# Patient Record
Sex: Female | Born: 1970 | Race: White | Hispanic: No | Smoking: Current every day smoker
Health system: Southern US, Community
[De-identification: ages and names within clinical notes are randomized; demographics above are authoritative.]

## PROBLEM LIST (undated history)

## (undated) DIAGNOSIS — E7800 Pure hypercholesterolemia, unspecified: Secondary | ICD-10-CM

## (undated) DIAGNOSIS — I10 Essential (primary) hypertension: Secondary | ICD-10-CM

## (undated) DIAGNOSIS — G629 Polyneuropathy, unspecified: Secondary | ICD-10-CM

## (undated) DIAGNOSIS — E119 Type 2 diabetes mellitus without complications: Secondary | ICD-10-CM

## (undated) HISTORY — PX: KNEE SURGERY: SHX244

## (undated) HISTORY — PX: CHOLECYSTECTOMY: SHX55

---

## 2015-09-26 ENCOUNTER — Inpatient Hospital Stay (HOSPITAL_COMMUNITY): Payer: Medicare Other

## 2015-09-26 ENCOUNTER — Inpatient Hospital Stay (HOSPITAL_COMMUNITY): Payer: Medicare Other | Admitting: Anesthesiology

## 2015-09-26 ENCOUNTER — Inpatient Hospital Stay (HOSPITAL_COMMUNITY)
Admission: EM | Admit: 2015-09-26 | Discharge: 2015-09-27 | DRG: 494 | Disposition: A | Discharge status: Home / Self Care | Payer: Medicare Other | Attending: Orthopedic Surgery | Admitting: Orthopedic Surgery

## 2015-09-26 ENCOUNTER — Encounter (HOSPITAL_COMMUNITY): Payer: Self-pay | Admitting: *Deleted

## 2015-09-26 ENCOUNTER — Encounter (HOSPITAL_COMMUNITY)
Admission: EM | Disposition: A | Discharge status: Home / Self Care | Payer: Self-pay | Source: Home / Self Care | Attending: Orthopedic Surgery

## 2015-09-26 DIAGNOSIS — E119 Type 2 diabetes mellitus without complications: Secondary | ICD-10-CM | POA: Diagnosis present

## 2015-09-26 DIAGNOSIS — Y92000 Kitchen of unspecified non-institutional (private) residence as  the place of occurrence of the external cause: Secondary | ICD-10-CM | POA: Diagnosis not present

## 2015-09-26 DIAGNOSIS — S82302A Unspecified fracture of lower end of left tibia, initial encounter for closed fracture: Secondary | ICD-10-CM | POA: Diagnosis present

## 2015-09-26 DIAGNOSIS — S82402A Unspecified fracture of shaft of left fibula, initial encounter for closed fracture: Secondary | ICD-10-CM | POA: Diagnosis present

## 2015-09-26 DIAGNOSIS — I10 Essential (primary) hypertension: Secondary | ICD-10-CM | POA: Diagnosis present

## 2015-09-26 DIAGNOSIS — W010XXA Fall on same level from slipping, tripping and stumbling without subsequent striking against object, initial encounter: Secondary | ICD-10-CM | POA: Diagnosis present

## 2015-09-26 DIAGNOSIS — M25572 Pain in left ankle and joints of left foot: Secondary | ICD-10-CM | POA: Diagnosis present

## 2015-09-26 DIAGNOSIS — Z794 Long term (current) use of insulin: Secondary | ICD-10-CM

## 2015-09-26 DIAGNOSIS — Z79899 Other long term (current) drug therapy: Secondary | ICD-10-CM

## 2015-09-26 DIAGNOSIS — S82202A Unspecified fracture of shaft of left tibia, initial encounter for closed fracture: Secondary | ICD-10-CM | POA: Diagnosis present

## 2015-09-26 DIAGNOSIS — S82872A Displaced pilon fracture of left tibia, initial encounter for closed fracture: Principal | ICD-10-CM | POA: Diagnosis present

## 2015-09-26 DIAGNOSIS — F17200 Nicotine dependence, unspecified, uncomplicated: Secondary | ICD-10-CM | POA: Diagnosis present

## 2015-09-26 DIAGNOSIS — Z419 Encounter for procedure for purposes other than remedying health state, unspecified: Secondary | ICD-10-CM

## 2015-09-26 HISTORY — DX: Type 2 diabetes mellitus without complications: E11.9

## 2015-09-26 HISTORY — DX: Pure hypercholesterolemia, unspecified: E78.00

## 2015-09-26 HISTORY — DX: Essential (primary) hypertension: I10

## 2015-09-26 HISTORY — DX: Polyneuropathy, unspecified: G62.9

## 2015-09-26 HISTORY — PX: EXTERNAL FIXATION LEG: SHX1549

## 2015-09-26 LAB — BASIC METABOLIC PANEL
Anion gap: 8 (ref 5–15)
BUN: 8 mg/dL (ref 6–20)
CALCIUM: 9.2 mg/dL (ref 8.9–10.3)
CO2: 26 mmol/L (ref 22–32)
CREATININE: 0.61 mg/dL (ref 0.44–1.00)
Chloride: 105 mmol/L (ref 101–111)
GFR calc Af Amer: >60 mL/min (ref >60)
GLUCOSE: 260 mg/dL — AB (ref 65–99)
Potassium: 4 mmol/L (ref 3.5–5.1)
Sodium: 139 mmol/L (ref 135–145)

## 2015-09-26 LAB — CBC
HEMATOCRIT: 47.3 % — AB (ref 36.0–46.0)
HEMATOCRIT: 42.9 % (ref 36.0–46.0)
HEMOGLOBIN: 14 g/dL (ref 12.0–15.0)
Hemoglobin: 15.5 g/dL — ABNORMAL HIGH (ref 12.0–15.0)
MCH: 30.6 pg (ref 26.0–34.0)
MCH: 30 pg (ref 26.0–34.0)
MCHC: 32.8 g/dL (ref 30.0–36.0)
MCHC: 32.6 g/dL (ref 30.0–36.0)
MCV: 93.3 fL (ref 78.0–100.0)
MCV: 92.1 fL (ref 78.0–100.0)
PLATELETS: 236 10*3/uL (ref 150–400)
Platelets: 191 10*3/uL (ref 150–400)
RBC: 5.07 MIL/uL (ref 3.87–5.11)
RBC: 4.66 MIL/uL (ref 3.87–5.11)
RDW: 13.6 % (ref 11.5–15.5)
RDW: 13.5 % (ref 11.5–15.5)
WBC: 15.9 10*3/uL — ABNORMAL HIGH (ref 4.0–10.5)
WBC: 14.1 10*3/uL — ABNORMAL HIGH (ref 4.0–10.5)

## 2015-09-26 LAB — GLUCOSE, CAPILLARY
GLUCOSE-CAPILLARY: 292 mg/dL — AB (ref 65–99)
Glucose-Capillary: 216 mg/dL — ABNORMAL HIGH (ref 65–99)
Glucose-Capillary: 229 mg/dL — ABNORMAL HIGH (ref 65–99)

## 2015-09-26 LAB — HCG, SERUM, QUALITATIVE: Preg, Serum: NEGATIVE

## 2015-09-26 LAB — CREATININE, SERUM
CREATININE: 0.64 mg/dL (ref 0.44–1.00)
GFR calc Af Amer: >60 mL/min (ref >60)

## 2015-09-26 SURGERY — EXTERNAL FIXATION, LOWER EXTREMITY
Anesthesia: General | Site: Leg Lower | Laterality: Left

## 2015-09-26 MED ORDER — PROPOFOL 10 MG/ML IV BOLUS
INTRAVENOUS | Status: DC | PRN
  Administered 2015-09-26: 150 mg via INTRAVENOUS

## 2015-09-26 MED ORDER — INSULIN ASPART 100 UNIT/ML ~~LOC~~ SOLN: 5.0000 [IU] | Freq: Once | SUBCUTANEOUS | Status: DC

## 2015-09-26 MED ORDER — POLYETHYLENE GLYCOL 3350 17 G PO PACK: 17.0000 g | PACK | Freq: Every day | ORAL | Status: DC | PRN

## 2015-09-26 MED ORDER — ONDANSETRON HCL 4 MG/2ML IJ SOLN: 4.0000 mg | Freq: Four times a day (QID) | INTRAMUSCULAR | Status: DC | PRN

## 2015-09-26 MED ORDER — ACETAMINOPHEN 325 MG PO TABS: 650.0000 mg | ORAL_TABLET | Freq: Four times a day (QID) | ORAL | Status: DC | PRN

## 2015-09-26 MED ORDER — ALPRAZOLAM 0.5 MG PO TABS
0.5000 mg | ORAL_TABLET | Freq: Two times a day (BID) | ORAL | Status: DC | PRN
  Administered 2015-09-26: 0.5 mg via ORAL
  Filled 2015-09-26: qty 1

## 2015-09-26 MED ORDER — METOCLOPRAMIDE HCL 5 MG/ML IJ SOLN: 5.0000 mg | Freq: Three times a day (TID) | INTRAMUSCULAR | Status: DC | PRN

## 2015-09-26 MED ORDER — OXYCODONE HCL 5 MG PO TABS
15.0000 mg | ORAL_TABLET | ORAL | Status: DC | PRN
  Administered 2015-09-26 – 2015-09-27 (×4): 15 mg via ORAL
  Filled 2015-09-26 (×4): qty 3

## 2015-09-26 MED ORDER — PANTOPRAZOLE SODIUM 40 MG PO TBEC
80.0000 mg | DELAYED_RELEASE_TABLET | Freq: Every day | ORAL | Status: DC
  Administered 2015-09-26 – 2015-09-27 (×2): 80 mg via ORAL
  Filled 2015-09-26 (×2): qty 2

## 2015-09-26 MED ORDER — INSULIN ASPART 100 UNIT/ML ~~LOC~~ SOLN
0.0000 [IU] | Freq: Three times a day (TID) | SUBCUTANEOUS | Status: DC
  Administered 2015-09-27 (×2): 3 [IU] via SUBCUTANEOUS

## 2015-09-26 MED ORDER — SODIUM CHLORIDE 0.9 % IV SOLN
INTRAVENOUS | Status: DC
  Administered 2015-09-26: 19:00:00 via INTRAVENOUS

## 2015-09-26 MED ORDER — OXYCODONE HCL ER 20 MG PO T12A
20.0000 mg | EXTENDED_RELEASE_TABLET | Freq: Two times a day (BID) | ORAL | Status: DC
  Administered 2015-09-26 – 2015-09-27 (×2): 20 mg via ORAL
  Filled 2015-09-26 (×2): qty 1

## 2015-09-26 MED ORDER — SODIUM CHLORIDE 0.9 % IV SOLN
INTRAVENOUS | Status: DC
  Administered 2015-09-26: 75 mL/h via INTRAVENOUS

## 2015-09-26 MED ORDER — HYDROMORPHONE HCL 1 MG/ML IJ SOLN
1.0000 mg | INTRAMUSCULAR | Status: DC | PRN
  Administered 2015-09-27 (×4): 2 mg via INTRAVENOUS
  Filled 2015-09-26 (×4): qty 2

## 2015-09-26 MED ORDER — ONDANSETRON HCL 4 MG/2ML IJ SOLN
INTRAMUSCULAR | Status: DC | PRN
  Administered 2015-09-26: 4 mg via INTRAVENOUS

## 2015-09-26 MED ORDER — CHLORHEXIDINE GLUCONATE 4 % EX LIQD: 60.0000 mL | Freq: Once | CUTANEOUS | Status: DC

## 2015-09-26 MED ORDER — CANAGLIFLOZIN 300 MG PO TABS
300.0000 mg | ORAL_TABLET | Freq: Every day | ORAL | Status: DC
  Administered 2015-09-27: 300 mg via ORAL
  Filled 2015-09-26: qty 1

## 2015-09-26 MED ORDER — ACETAMINOPHEN 650 MG RE SUPP: 650.0000 mg | Freq: Four times a day (QID) | RECTAL | Status: DC | PRN

## 2015-09-26 MED ORDER — FENTANYL CITRATE (PF) 250 MCG/5ML IJ SOLN
INTRAMUSCULAR | Status: AC
  Filled 2015-09-26: qty 5

## 2015-09-26 MED ORDER — FENTANYL CITRATE (PF) 100 MCG/2ML IJ SOLN
INTRAMUSCULAR | Status: DC | PRN
  Administered 2015-09-26: 100 ug via INTRAVENOUS

## 2015-09-26 MED ORDER — MIDAZOLAM HCL 5 MG/5ML IJ SOLN
INTRAMUSCULAR | Status: DC | PRN
  Administered 2015-09-26: 2 mg via INTRAVENOUS

## 2015-09-26 MED ORDER — PREGABALIN 100 MG PO CAPS
200.0000 mg | ORAL_CAPSULE | Freq: Three times a day (TID) | ORAL | Status: DC
  Administered 2015-09-26 – 2015-09-27 (×2): 200 mg via ORAL
  Filled 2015-09-26 (×2): qty 2

## 2015-09-26 MED ORDER — ONDANSETRON HCL 4 MG/2ML IJ SOLN: 4.0000 mg | Freq: Once | INTRAMUSCULAR | Status: DC | PRN

## 2015-09-26 MED ORDER — ROCURONIUM BROMIDE 100 MG/10ML IV SOLN
INTRAVENOUS | Status: DC | PRN
  Administered 2015-09-26: 50 mg via INTRAVENOUS

## 2015-09-26 MED ORDER — MIDAZOLAM HCL 2 MG/2ML IJ SOLN
INTRAMUSCULAR | Status: AC
  Filled 2015-09-26: qty 2

## 2015-09-26 MED ORDER — 0.9 % SODIUM CHLORIDE (POUR BTL) OPTIME
TOPICAL | Status: DC | PRN
  Administered 2015-09-26: 1000 mL

## 2015-09-26 MED ORDER — ONDANSETRON HCL 4 MG/2ML IJ SOLN
INTRAMUSCULAR | Status: AC
  Filled 2015-09-26: qty 4

## 2015-09-26 MED ORDER — LIDOCAINE HCL (CARDIAC) 20 MG/ML IV SOLN
INTRAVENOUS | Status: DC | PRN
  Administered 2015-09-26: 80 mg via INTRAVENOUS

## 2015-09-26 MED ORDER — FENTANYL CITRATE (PF) 100 MCG/2ML IJ SOLN
INTRAMUSCULAR | Status: AC
  Filled 2015-09-26: qty 2

## 2015-09-26 MED ORDER — TIZANIDINE HCL 4 MG PO TABS: 4.0000 mg | ORAL_TABLET | Freq: Two times a day (BID) | ORAL | Status: DC | PRN

## 2015-09-26 MED ORDER — ATORVASTATIN CALCIUM 20 MG PO TABS: 20.0000 mg | ORAL_TABLET | Freq: Every day | ORAL | Status: DC

## 2015-09-26 MED ORDER — INSULIN ASPART 100 UNIT/ML ~~LOC~~ SOLN
4.0000 [IU] | Freq: Three times a day (TID) | SUBCUTANEOUS | Status: DC
  Administered 2015-09-27 (×2): 4 [IU] via SUBCUTANEOUS

## 2015-09-26 MED ORDER — HYDROMORPHONE HCL 1 MG/ML IJ SOLN
1.0000 mg | INTRAMUSCULAR | Status: DC | PRN
Start: 2015-09-26 — End: 2015-09-27
  Administered 2015-09-26: 1 mg via INTRAVENOUS
  Filled 2015-09-26: qty 1

## 2015-09-26 MED ORDER — LIDOCAINE HCL 4 % EX SOLN
CUTANEOUS | Status: DC | PRN
  Administered 2015-09-26: 4 mL via TOPICAL

## 2015-09-26 MED ORDER — HYDROXYZINE HCL 10 MG PO TABS
10.0000 mg | ORAL_TABLET | Freq: Three times a day (TID) | ORAL | Status: DC | PRN
  Filled 2015-09-26: qty 1

## 2015-09-26 MED ORDER — LIDOCAINE 2% (20 MG/ML) 5 ML SYRINGE
INTRAMUSCULAR | Status: AC
  Filled 2015-09-26: qty 5

## 2015-09-26 MED ORDER — ROCURONIUM BROMIDE 50 MG/5ML IV SOLN
INTRAVENOUS | Status: AC
  Filled 2015-09-26: qty 1

## 2015-09-26 MED ORDER — LACTATED RINGERS IV SOLN
INTRAVENOUS | Status: DC
  Administered 2015-09-26 (×2): via INTRAVENOUS

## 2015-09-26 MED ORDER — ONDANSETRON HCL 4 MG PO TABS: 4.0000 mg | ORAL_TABLET | Freq: Four times a day (QID) | ORAL | Status: DC | PRN

## 2015-09-26 MED ORDER — HYDROMORPHONE HCL 1 MG/ML IJ SOLN
1.0000 mg | Freq: Once | INTRAMUSCULAR | Status: AC
  Administered 2015-09-26: 1 mg via INTRAVENOUS
  Filled 2015-09-26: qty 1

## 2015-09-26 MED ORDER — ENOXAPARIN SODIUM 40 MG/0.4ML ~~LOC~~ SOLN
40.0000 mg | SUBCUTANEOUS | Status: DC
  Administered 2015-09-27: 40 mg via SUBCUTANEOUS
  Filled 2015-09-26: qty 0.4

## 2015-09-26 MED ORDER — INSULIN ASPART 100 UNIT/ML ~~LOC~~ SOLN
0.0000 [IU] | Freq: Every day | SUBCUTANEOUS | Status: DC
  Administered 2015-09-26: 3 [IU] via SUBCUTANEOUS

## 2015-09-26 MED ORDER — SUGAMMADEX SODIUM 200 MG/2ML IV SOLN
INTRAVENOUS | Status: AC
  Filled 2015-09-26: qty 2

## 2015-09-26 MED ORDER — METOCLOPRAMIDE HCL 5 MG PO TABS: 5.0000 mg | ORAL_TABLET | Freq: Three times a day (TID) | ORAL | Status: DC | PRN

## 2015-09-26 MED ORDER — FENTANYL CITRATE (PF) 100 MCG/2ML IJ SOLN: 25.0000 ug | INTRAMUSCULAR | Status: DC | PRN

## 2015-09-26 MED ORDER — DOCUSATE SODIUM 100 MG PO CAPS
100.0000 mg | ORAL_CAPSULE | Freq: Two times a day (BID) | ORAL | Status: DC
  Administered 2015-09-26 – 2015-09-27 (×2): 100 mg via ORAL
  Filled 2015-09-26 (×2): qty 1

## 2015-09-26 MED ORDER — CEFAZOLIN SODIUM-DEXTROSE 2-4 GM/100ML-% IV SOLN
2.0000 g | Freq: Four times a day (QID) | INTRAVENOUS | Status: AC
  Administered 2015-09-26 – 2015-09-27 (×3): 2 g via INTRAVENOUS
  Filled 2015-09-26 (×3): qty 100

## 2015-09-26 MED ORDER — PROPOFOL 10 MG/ML IV BOLUS
INTRAVENOUS | Status: AC
  Filled 2015-09-26: qty 20

## 2015-09-26 MED ORDER — LISINOPRIL 20 MG PO TABS
20.0000 mg | ORAL_TABLET | Freq: Every day | ORAL | Status: DC
  Administered 2015-09-26 – 2015-09-27 (×2): 20 mg via ORAL
  Filled 2015-09-26 (×2): qty 1

## 2015-09-26 MED ORDER — ALBUTEROL SULFATE HFA 108 (90 BASE) MCG/ACT IN AERS
INHALATION_SPRAY | RESPIRATORY_TRACT | Status: DC | PRN
  Administered 2015-09-26: 2 via RESPIRATORY_TRACT

## 2015-09-26 MED ORDER — INSULIN ASPART 100 UNIT/ML ~~LOC~~ SOLN
SUBCUTANEOUS | Status: AC
  Filled 2015-09-26: qty 5

## 2015-09-26 MED ORDER — CEFAZOLIN SODIUM-DEXTROSE 2-4 GM/100ML-% IV SOLN
2.0000 g | INTRAVENOUS | Status: AC
  Administered 2015-09-26: 2 g via INTRAVENOUS
  Filled 2015-09-26: qty 100

## 2015-09-26 MED ORDER — SUGAMMADEX SODIUM 200 MG/2ML IV SOLN
INTRAVENOUS | Status: DC | PRN
  Administered 2015-09-26: 200 mg via INTRAVENOUS

## 2015-09-26 SURGICAL SUPPLY — 36 items
BANDAGE ELASTIC 4 VELCRO ST LF (GAUZE/BANDAGES/DRESSINGS) ×4 IMPLANT
BAR GLASS FIBER EXFX 11X350 (EXFIX) ×2 IMPLANT
BNDG GAUZE ELAST 4 BULKY (GAUZE/BANDAGES/DRESSINGS) ×6 IMPLANT
CLAMP BLUE BAR TO PIN (MISCELLANEOUS) ×4 IMPLANT
COVER SURGICAL LIGHT HANDLE (MISCELLANEOUS) ×2 IMPLANT
DRAPE C-ARM 42X72 X-RAY (DRAPES) ×2 IMPLANT
DRAPE IMP U-DRAPE 54X76 (DRAPES) ×2 IMPLANT
DRAPE U-SHAPE 47X51 STRL (DRAPES) ×2 IMPLANT
DURAPREP 26ML APPLICATOR (WOUND CARE) ×2 IMPLANT
ELECT REM PT RETURN 9FT ADLT (ELECTROSURGICAL) ×2
ELECTRODE REM PT RTRN 9FT ADLT (ELECTROSURGICAL) ×1 IMPLANT
GLOVE BIOGEL PI ORTHO PRO 7.5 (GLOVE) ×1
GLOVE BIOGEL PI ORTHO PRO SZ8 (GLOVE) ×1
GLOVE ORTHO TXT STRL SZ7.5 (GLOVE) ×2 IMPLANT
GLOVE PI ORTHO PRO STRL 7.5 (GLOVE) ×1 IMPLANT
GLOVE PI ORTHO PRO STRL SZ8 (GLOVE) ×1 IMPLANT
GLOVE SURG ORTHO 8.5 STRL (GLOVE) ×2 IMPLANT
GOWN STRL REUS W/ TWL XL LVL3 (GOWN DISPOSABLE) ×3 IMPLANT
GOWN STRL REUS W/TWL XL LVL3 (GOWN DISPOSABLE) ×3
KIT BASIN OR (CUSTOM PROCEDURE TRAY) ×2 IMPLANT
KIT ROOM TURNOVER OR (KITS) ×2 IMPLANT
NS IRRIG 1000ML POUR BTL (IV SOLUTION) ×2 IMPLANT
PACK ORTHO EXTREMITY (CUSTOM PROCEDURE TRAY) ×2 IMPLANT
PAD ARMBOARD 7.5X6 YLW CONV (MISCELLANEOUS) ×4 IMPLANT
PENCIL BUTTON HOLSTER BLD 10FT (ELECTRODE) ×2 IMPLANT
PIN CLAMP 2BAR 75MM BLUE (PIN) ×2 IMPLANT
PIN HALF YELLOW 5X160X35 (PIN) ×4 IMPLANT
PIN TRANSFIXING 5.0 (PIN) ×2 IMPLANT
SPONGE LAP 18X18 X RAY DECT (DISPOSABLE) ×2 IMPLANT
STOCKINETTE 6  STRL (DRAPES) ×1
STOCKINETTE 6 STRL (DRAPES) ×1 IMPLANT
SUT ETHILON 2 0 FS 18 (SUTURE) ×2 IMPLANT
TOWEL OR 17X24 6PK STRL BLUE (TOWEL DISPOSABLE) ×2 IMPLANT
TOWEL OR 17X26 10 PK STRL BLUE (TOWEL DISPOSABLE) ×2 IMPLANT
TUBE CONNECTING 12X1/4 (SUCTIONS) ×2 IMPLANT
YANKAUER SUCT BULB TIP NO VENT (SUCTIONS) ×2 IMPLANT

## 2015-09-26 NOTE — ED Notes (Signed)
Placed pt on bed pan.

## 2015-09-26 NOTE — Anesthesia Postprocedure Evaluation (Signed)
Anesthesia Post Note  Patient: Alisha Mendoza  Procedure(s) Performed: Procedure(s) (LRB): EXTERNAL FIXATION LEFT TIBIA/FIBULA (Left)  Patient location during evaluation: PACU Anesthesia Type: General and Regional Level of consciousness: awake, awake and alert and oriented Pain management: pain level controlled Vital Signs Assessment: post-procedure vital signs reviewed and stable Respiratory status: spontaneous breathing, nonlabored ventilation and respiratory function stable Cardiovascular status: blood pressure returned to baseline Anesthetic complications: no    Last Vitals:  Filed Vitals:   09/26/15 1645 09/26/15 1653  BP: 127/92 123/74  Pulse: 78 75  Temp: 36.8 C   Resp: 16 18    Last Pain:  Filed Vitals:   09/26/15 1656  PainSc: Asleep                 Dameion Briles COKER

## 2015-09-26 NOTE — ED Provider Notes (Signed)
   S- Patient transferred to the Care of Dr. Ranell PatrickNorris with orthopedics for multiple fractures of the left tibia that occurred last night. C/o pain. Denies numbness or tingling. She tripped over her dog. Denies head injury or LOC.  O- BP 159/107 mmHg  Pulse 86  Temp(Src) 98.4 F (36.9 C) (Oral)  Resp 23  Ht 5\' 3"  (1.6 m)  Wt 81.194 kg  BMI 31.72 kg/m2  SpO2 98% Patient with htn. Left leg with swollen knee , soft compartments, strong pedal pulses  A/P- Patient placed in the care of Dr. Ranell PatrickNorris for further mangament. Stable   Arthor CaptainAbigail Terissa Haffey, PA-C 09/30/15 0037  Alvira MondayErin Schlossman, MD 09/30/15 1321

## 2015-09-26 NOTE — Brief Op Note (Signed)
09/26/2015  3:16 PM  PATIENT:  Lily Kocherracey Cook Plyler  [age] y.o. female  PRE-OPERATIVE DIAGNOSIS:  Comminuted displaced distal left tibia fracture  POST-OPERATIVE DIAGNOSIS:  Comminuted displaced distal left tibia fracture  PROCEDURE:  Procedure(s): EXTERNAL FIXATION LEFT TIBIA/FIBULA (Left) Zimmer X Fix  SURGEON:  Surgeon(s) and Role:    * Beverely LowSteve Anatasia Tino, MD - Primary  PHYSICIAN ASSISTANT:   ASSISTANTS: Thea Gisthomas B Dixon, PA-C   ANESTHESIA:   general  EBL:  Total I/O In: 1000 [I.V.:1000] Out: -   BLOOD ADMINISTERED:none  DRAINS: none   LOCAL MEDICATIONS USED:  NONE  SPECIMEN:  No Specimen  DISPOSITION OF SPECIMEN:  N/A  COUNTS:  YES  TOURNIQUET:  * No tourniquets in log *  DICTATION: .Other Dictation: Dictation Number [id number]  PLAN OF CARE: Admit to inpatient   PATIENT DISPOSITION:  PACU - hemodynamically stable.   Delay start of Pharmacological VTE agent (>24hrs) due to surgical blood loss or risk of bleeding: not applicable

## 2015-09-26 NOTE — Anesthesia Procedure Notes (Addendum)
Anesthesia Regional Block:  Popliteal block  Pre-Anesthetic Checklist: ,, timeout performed, Correct Patient, Correct Site, Correct Laterality, Correct Procedure, Correct Position, site marked, Risks and benefits discussed,  Surgical consent,  Pre-op evaluation,  At surgeon's request and post-op pain management  Laterality: Left  Prep: chloraprep       Needles:  Injection technique: Single-shot     Needle Length: 9cm 9 cm Needle Gauge: 22 and 22 G    Additional Needles:  Procedures: nerve stimulator Popliteal block Narrative:  Start time: 09/26/2015 1:40 PM End time: 09/26/2015 2:45 PM Injection made incrementally with aspirations every 5 mL.  Performed by: Personally   Additional Notes: 30 cc 0.5% Bupivacaine injected easily   Anesthesia Regional Block:  Adductor canal block  Pre-Anesthetic Checklist: ,, timeout performed, Correct Patient, Correct Site, Correct Laterality, Correct Procedure, Correct Position, site marked, Risks and benefits discussed,  Surgical consent,  Pre-op evaluation,  At surgeon's request and post-op pain management  Laterality: Left  Prep: chloraprep       Needles:  Injection technique: Single-shot  Needle Type: Echogenic Stimulator Needle     Needle Length: 9cm 9 cm Needle Gauge: 21 and 21 G    Additional Needles:  Procedures: ultrasound guided (picture in chart) Adductor canal block Narrative:  Start time: 09/26/2015 1:45 PM End time: 09/26/2015 1:50 PM Injection made incrementally with aspirations every 5 mL.  Performed by: Personally   Additional Notes: 20 cc 0.5% Bupivacaine injected easily   Procedure Name: Intubation Date/Time: 09/26/2015 2:37 PM Performed by: Mariea Clonts Pre-anesthesia Checklist: Emergency Drugs available, Patient identified, Timeout performed, Suction available and Patient being monitored Patient Re-evaluated:Patient Re-evaluated prior to inductionOxygen Delivery Method: Circle system  utilized Preoxygenation: Pre-oxygenation with 100% oxygen Intubation Type: IV induction Ventilation: Oral airway inserted - appropriate to patient size and Mask ventilation without difficulty Laryngoscope Size: Mac and 3 Grade View: Grade I Tube size: 7.0 mm Number of attempts: 1 Airway Equipment and Method: LTA kit utilized Placement Confirmation: ETT inserted through vocal cords under direct vision,  positive ETCO2 and breath sounds checked- equal and bilateral Tube secured with: Tape Dental Injury: Teeth and Oropharynx as per pre-operative assessment

## 2015-09-26 NOTE — ED Notes (Signed)
Pt in from [facility] for Dr. Ranell PatrickNorris to eval for x 4 L leg fractures, per report pt tripped today over her dog & fell to floor from standing position, pt arrives to ED with [facility], pt rcvd 1 mg Dilaudid @ 8:55, rates pain 10/10, A&O x4

## 2015-09-26 NOTE — H&P (Signed)
  Alisha Mendoza is an 45 y.o. female.    Chief Complaint: left ankle pain s/p fall HPI: 45 y/o female in her kitchen this morning, tripped injuring her left ankle. [place] seen in [place] and transferred here for management of left distal tib/fib fractures. No hx of open injury by [facility] provider. Denies any other injuries s/p this incident. Denies any previous problems with left ankle/leg in the past. C/o moderate to severe pain in the ankle. Currently splinted.  PCP:  No primary care provider on file.  PMH: No past medical history on file.  PSH: No past surgical history on file.  Social History:  has no tobacco, alcohol, and drug history on file.  Allergies:  Allergies  Allergen Reactions  . Codeine Anaphylaxis    Medications: No current facility-administered medications for this encounter.   No current outpatient prescriptions on file.    No results found for this or any previous visit (from the past 48 hour(s)). No results found.  ROS: ROS Usually ambulates without assistance Unable to ambulate after this injury Otherwise ROS negative  Physical Exam: Alert and appropriate 45 y/o female tearful due to pain Cervical spine with full rom, no tenderness Bilateral upper extremities with full rom, no tenderness, no deformity Right lower extremity full rom, no tenderness Pelvis stable and non tender Left lower extremity currently splint  Normal exam of left knee and left hip nv intact distally bilaterally Physical Exam   Assessment/Plan Assessment: left tib/fib fractures, proximal fibula fractures  Plan: Reviewed films with Dr. Ranell PatrickNorris showing distal left tib/fib fractures but pattern is hard to determine to the quality of the x-rays.  Keep NPO for now. Will plan for surgery later today Strict bedrest Pain management as needed

## 2015-09-26 NOTE — Anesthesia Preprocedure Evaluation (Signed)
Anesthesia Evaluation  Patient identified by MRN, date of birth, ID band Patient awake    Reviewed: Allergy & Precautions, NPO status , Patient's Chart, lab work & pertinent test results  Airway Mallampati: II  TM Distance: >3 FB Neck ROM: Full    Dental  (+) Edentulous Upper, Edentulous Lower   Pulmonary Current Smoker,  breath sounds clear to auscultation        Cardiovascular hypertension, Rhythm:Regular Rate:Normal     Neuro/Psych    GI/Hepatic   Endo/Other  diabetes  Renal/GU      Musculoskeletal   Abdominal   Peds  Hematology   Anesthesia Other Findings   Reproductive/Obstetrics                             Anesthesia Physical Anesthesia Plan  ASA: III  Anesthesia Plan: General   Post-op Pain Management:    Induction: Intravenous  Airway Management Planned: Oral ETT  Additional Equipment:   Intra-op Plan:   Post-operative Plan:   Informed Consent: I have reviewed the patients History and Physical, chart, labs and discussed the procedure including the risks, benefits and alternatives for the proposed anesthesia with the patient or authorized representative who has indicated his/her understanding and acceptance.     Plan Discussed with: CRNA and Anesthesiologist  Anesthesia Plan Comments:         Anesthesia Quick Evaluation  

## 2015-09-26 NOTE — Interval H&P Note (Signed)
History and Physical Interval Note:  09/26/2015 1:54 PM  Alisha Mendoza  has presented today for surgery, with the diagnosis of ex fix leg  The various methods of treatment have been discussed with the patient and family. After consideration of risks, benefits and other options for treatment, the patient has consented to  Procedure(s): EXTERNAL FIXATION PLACEMENT LOWER LEG (Left) as a surgical intervention .  The patient's history has been reviewed, patient examined, no change in status, stable for surgery.  I have reviewed the patient's chart and labs.  Questions were answered to the patient's satisfaction.     Maryjo Ragon,STEVEN R

## 2015-09-26 NOTE — Transfer of Care (Signed)
Immediate Anesthesia Transfer of Care Note  Patient: Alisha Mendoza  Procedure(s) Performed: Procedure(s): EXTERNAL FIXATION LEFT TIBIA/FIBULA (Left)  Patient Location: PACU  Anesthesia Type:General and Regional  Level of Consciousness: awake, alert  and oriented  Airway & Oxygen Therapy: Patient Spontanous Breathing  Post-op Assessment: Report given to RN, Post -op Vital signs reviewed and stable and Patient moving all extremities X 4  Post vital signs: Reviewed and stable  Last Vitals:  Filed Vitals:   09/26/15 1350 09/26/15 1355  BP: 141/83 135/78  Pulse: 72 67  Temp:    Resp: 15 14    Last Pain:  Filed Vitals:   09/26/15 1359  PainSc: 10-Worst pain ever         Complications: No apparent anesthesia complications

## 2015-09-27 LAB — GLUCOSE, CAPILLARY
GLUCOSE-CAPILLARY: 173 mg/dL — AB (ref 65–99)
Glucose-Capillary: 165 mg/dL — ABNORMAL HIGH (ref 65–99)

## 2015-09-27 MED ORDER — HYDROMORPHONE HCL 2 MG PO TABS
2.0000 mg | ORAL_TABLET | ORAL | Status: DC | PRN
  Administered 2015-09-27 (×2): 2 mg via ORAL
  Filled 2015-09-27 (×2): qty 1

## 2015-09-27 MED ORDER — HYDROMORPHONE HCL 2 MG PO TABS: 2.0000 mg | ORAL_TABLET | ORAL | Status: AC | PRN

## 2015-09-27 NOTE — Op Note (Signed)
NAMEverlene Other:  Mendoza, Alisha Mendoza                 ACCOUNT NO.:  [id number]  MEDICAL RECORD NO.:  [id number]  LOCATION:  [id number]                        FACILITY:  [facility]  PHYSICIAN:  Almedia BallsSteven R. Ranell PatrickNorris, M.D. DATE OF BIRTH:  [birth year]  DATE OF PROCEDURE:  09/26/2015 DATE OF DISCHARGE:                              OPERATIVE REPORT   PREOPERATIVE DIAGNOSIS:  Displaced, comminuted left pilon fracture/distal tibia fracture.  POSTOPERATIVE DIAGNOSIS:  Displaced, comminuted left pilon fracture/distal tibia fracture.  PROCEDURE PERFORMED:  Closed reduction and external fixation, left distal tibia fracture.  ATTENDING SURGEON:  Almedia BallsSteven R. Ranell PatrickNorris, M.D.  ASSISTANT:  Konrad Felixhomas Brad Dixon, [place], who was scrubbed the entire procedure and necessary for satisfactory completion of surgery.  ANESTHESIA:  General anesthesia was used.  ESTIMATED BLOOD LOSS:  Minimal.  FLUID REPLACEMENT:  500 mL crystalloid.  COUNTS:  Instrument counts were correct.  COMPLICATIONS:  There were no complications.  ANTIBIOTICS:  Perioperative antibiotics were given.  INDICATIONS:  The patient is a 45 year old female who suffered a ground- level fall injuring her left ankle.  The patient presented initially to [facility], where she was noted to have a displaced and comminuted distal tibia/pilon fracture.  The [facility].  She was placed in a temporary splint.  Upon analysis of x-rays, the patient's ankle was still significantly short with the talus being impacted up into the tibia.  Due to concerns over the patient's skin and compartments, we recommended urgent external fixation of her ankle to restore length and to take the pressure off her skin.  Risks and benefits were discussed. Informed consent obtained.  DESCRIPTION OF PROCEDURE:  After an adequate level of anesthesia achieved, the patient was positioned supine on the operating room table. Radiolucent table  was utilized.  Sterile prep and drape performed.  Time- out called.  We used stab incisions and placed two anterior-to-posterior half pins into the tibia for the Zimmer ex-fix.  These were placed bicortical and verified on C-arm.  We then placed a transcalcaneal pin, centrally threaded from medial to lateral under x-ray guidance and once we were sure that the calcaneal pin was in the appropriate position, we pulled longitudinal traction, regaining length and alignment of the distal tibia.  We then assembled the delta frame, so, two carbon fiber rods; one medial, one lateral.  Placed pin-to-bar clamps and then tightened with the ankle in appropriate position.  We checked AP and lateral views, made sure that our position was appropriate.  We irrigated thoroughly and then applied a compressive bandage and then awakened the patient, transferred the patient to recovery room in stable condition.     Almedia BallsSteven R. Ranell PatrickNorris, M.D.     SRN/MEDQ  D:  09/26/2015  T:  09/27/2015  Job:  [id number]

## 2015-09-27 NOTE — Discharge Instructions (Signed)
Elevate the left leg above the heart at all times.  Wiggle toes  Mobilization NWB on the left with a walker - be careful  Follow up with Dr Victorino DikeHewitt in the office this week - call [phone] on Monday for that appointment

## 2015-09-27 NOTE — Progress Notes (Addendum)
CM received call from RN wheelchair order has been signed.  CM called [facility] DME rep, Fayrene FearingJames to please deliver to room along with rolling walker and 3n1.

## 2015-09-27 NOTE — Evaluation (Signed)
Physical Therapy Evaluation Patient Details Name: Alisha Mendoza MRN: [id number] DOB: 23-Mar-1971 Today's Date: 09/27/2015   History of Present Illness  Pt tripped and fell sustaining left distal tib/ fib comminuted fx. Transferred to [facility] from [place] for mgmt and underwent ex fix on 09/26/15. PMH: DM, multiple arthroscopic surgeries to right knee  Clinical Impression  Patient evaluated by Physical Therapy with no further acute PT needs identified. All education has been completed and the patient has no further questions. Pt performed transfers mod I keeping LLE NWB. Will need equipment listed below for mobility.  See below for any follow-up Physical Therapy or equipment needs. PT is signing off. Thank you for this referral.     Follow Up Recommendations No PT follow up;Supervision for mobility/OOB    Equipment Recommendations  Wheelchair (measurements PT);Rolling walker with 5" wheels;3in1 (PT)    Recommendations for Other Services       Precautions / Restrictions Precautions Precautions: Fall Restrictions Weight Bearing Restrictions: Yes LLE Weight Bearing: Non weight bearing      Mobility  Bed Mobility Overal bed mobility: Modified Independent             General bed mobility comments: pt able to lift LLE and get to EOB without assist  Transfers Overall transfer level: Modified independent Equipment used: Rolling walker (2 wheeled)             General transfer comment: pt stood safely to RW keeping LLE NWB and pivoted to [facility] and then to recliner. Was able to hop on RLE without wt on left.   Ambulation/Gait             General Gait Details: pt will generally be at transfer level with ex fix  Stairs Stairs:  (discussed w/c for stairs and ho given)          Wheelchair Mobility    Modified Rankin (Stroke Patients Only)       Balance Overall balance assessment: No apparent balance deficits (not formally assessed)                                           Pertinent Vitals/Pain Pain Assessment: 0-10 Pain Score: 8  Pain Location: left ankle Pain Descriptors / Indicators: Aching;Throbbing Pain Intervention(s): Limited activity within patient's tolerance;Monitored during session;Repositioned    Home Living Family/patient expects to be discharged to:: Private residence Living Arrangements: Children Available Help at Discharge: Family;Friend(s);Available PRN/intermittently Type of Home: House Home Access: Stairs to enter Entrance Stairs-Rails: None Entrance Stairs-Number of Steps: 2 Home Layout: One level Home Equipment: Walker - standard Additional Comments: pt's boyfriend lives down the street and will be with her often, her children are 33 and 17 and can assist when not at school    Prior Function Level of Independence: Independent               Hand Dominance        Extremity/Trunk Assessment   Upper Extremity Assessment: Overall WFL for tasks assessed           Lower Extremity Assessment: Overall WFL for tasks assessed      Cervical / Trunk Assessment: Normal  Communication   Communication: No difficulties  Cognition Arousal/Alertness: Awake/alert Behavior During Therapy: WFL for tasks assessed/performed Overall Cognitive Status: Within Functional Limits for tasks assessed  General Comments General comments (skin integrity, edema, etc.): discussed appropriate exercises for left knee and hip: SLR, LAQ, standing hip abd/ add, standing knee flex/ ext, moving toes    Exercises        Assessment/Plan    PT Assessment Patent does not need any further PT services  PT Diagnosis Acute pain;Difficulty walking   PT Problem List    PT Treatment Interventions     PT Goals (Current goals can be found in the Care Plan section) Acute Rehab PT Goals Patient Stated Goal: return home and rest PT Goal Formulation: All assessment and education complete, DC  therapy    Frequency     Barriers to discharge        Co-evaluation               End of Session Equipment Utilized During Treatment: Gait belt Activity Tolerance: Patient tolerated treatment well Patient left: in chair;with call bell/phone within reach;with family/visitor present Nurse Communication: Mobility status;Other (comment) (equipment needs)         Time: [id number] PT Time Calculation (min) (ACUTE ONLY): 25 min   Charges:   PT Evaluation $PT Eval Low Complexity: 1 Procedure PT Treatments $Therapeutic Activity: 8-22 mins   PT G Codes:      Lyanne CoVictoria Nickalos Petersen, PT  Acute Rehab Services  [phone]   Pecola LeisureManess, [place] 09/27/2015, 10:41 AM

## 2015-09-27 NOTE — Progress Notes (Signed)
Orthopedics Progress Note  Subjective: Patient reporting increased pain after the block wore off  Objective:  Filed Vitals:   09/27/15 0134 09/27/15 0437  BP: 107/84 105/66  Pulse: 76 72  Temp: 99.6 F (37.6 C) 99.1 F (37.3 C)  Resp: 18 18    General: Awake and alert  Musculoskeletal: left leg elevated with the Xfix.  Able to wiggle the toes without much pain Neurovascularly intact  Lab Results  Component Value Date   WBC 14.1* 09/26/2015   HGB 14.0 09/26/2015   HCT 42.9 09/26/2015   MCV 92.1 09/26/2015   PLT 191 09/26/2015       Component Value Date/Time   NA 139 09/26/2015 1107   K 4.0 09/26/2015 1107   CL 105 09/26/2015 1107   CO2 26 09/26/2015 1107   GLUCOSE 260* 09/26/2015 1107   BUN 8 09/26/2015 1107   CREATININE 0.64 09/26/2015 1853   CALCIUM 9.2 09/26/2015 1107   GFRNONAA >60 09/26/2015 1853   GFRAA >60 09/26/2015 1853    No results found for: INR, PROTIME  Assessment/Plan: POD # 1 s/p Procedure(s): EXTERNAL FIXATION LEFT TIBIA/FIBULA Possible discharge today if cleared by therapy STRICT NWB on the left with elevation of the left foot above the heart as much as possible  Viviann SpareSteven R. Ranell PatrickNorris, MD 09/27/2015 7:46 AM

## 2015-09-27 NOTE — Discharge Summary (Signed)
Physician Discharge Summary   Patient ID: Alisha Mendoza MRN: [id number] DOB/AGE: 1970-10-23 45 y.o.  Admit date: 09/26/2015 Discharge date: 09/27/2015  Admission Diagnoses:  Active Problems:   Fracture of fibula with tibia, left, closed   Fracture of tibia, distal, left, closed   Discharge Diagnoses:  Same   Surgeries: Procedure(s): EXTERNAL FIXATION LEFT TIBIA/FIBULA on 09/26/2015   Consultants: PT  Discharged Condition: Stable  Hospital Course: Alisha Mendoza is an 45 y.o. female who was admitted 09/26/2015 with a chief complaint of  Chief Complaint  Patient presents with  . Fall  . Leg Injury  , and found to have a diagnosis of left distal displaced fracture.  They were brought to the operating room on 09/26/2015 and underwent the above named procedures.    The patient had an uncomplicated hospital course and was stable for discharge.  Recent vital signs:  Filed Vitals:   09/27/15 0134 09/27/15 0437  BP: 107/84 105/66  Pulse: 76 72  Temp: 99.6 F (37.6 C) 99.1 F (37.3 C)  Resp: 18 18    Recent laboratory studies:  Results for orders placed or performed during the hospital encounter of 09/26/15  CBC  Result Value Ref Range   WBC 15.9 (H) 4.0 - 10.5 K/uL   RBC 5.07 3.87 - 5.11 MIL/uL   Hemoglobin 15.5 (H) 12.0 - 15.0 g/dL   HCT 14.747.3 (H) 82.936.0 - 56.246.0 %   MCV 93.3 78.0 - 100.0 fL   MCH 30.6 26.0 - 34.0 pg   MCHC 32.8 30.0 - 36.0 g/dL   RDW 13.013.6 86.511.5 - 78.415.5 %   Platelets 236 150 - 400 K/uL  Basic metabolic panel  Result Value Ref Range   Sodium 139 135 - 145 mmol/L   Potassium 4.0 3.5 - 5.1 mmol/L   Chloride 105 101 - 111 mmol/L   CO2 26 22 - 32 mmol/L   Glucose, Bld 260 (H) 65 - 99 mg/dL   BUN 8 6 - 20 mg/dL   Creatinine, Ser 6.960.61 0.44 - 1.00 mg/dL   Calcium 9.2 8.9 - 29.510.3 mg/dL   GFR calc non Af Amer >60 >60 mL/min   GFR calc Af Amer >60 >60 mL/min   Anion gap 8 5 - 15  hCG, serum, qualitative  Result Value Ref Range   Preg, Serum NEGATIVE  NEGATIVE  Glucose, capillary  Result Value Ref Range   Glucose-Capillary 216 (H) 65 - 99 mg/dL  Glucose, capillary  Result Value Ref Range   Glucose-Capillary 229 (H) 65 - 99 mg/dL   Comment 1 Notify RN    Comment 2 Document in Chart   CBC  Result Value Ref Range   WBC 14.1 (H) 4.0 - 10.5 K/uL   RBC 4.66 3.87 - 5.11 MIL/uL   Hemoglobin 14.0 12.0 - 15.0 g/dL   HCT 28.442.9 13.236.0 - 44.046.0 %   MCV 92.1 78.0 - 100.0 fL   MCH 30.0 26.0 - 34.0 pg   MCHC 32.6 30.0 - 36.0 g/dL   RDW 10.213.5 72.511.5 - 36.615.5 %   Platelets 191 150 - 400 K/uL  Creatinine, serum  Result Value Ref Range   Creatinine, Ser 0.64 0.44 - 1.00 mg/dL   GFR calc non Af Amer >60 >60 mL/min   GFR calc Af Amer >60 >60 mL/min  Glucose, capillary  Result Value Ref Range   Glucose-Capillary 292 (H) 65 - 99 mg/dL  Glucose, capillary  Result Value Ref Range   Glucose-Capillary 165 (H) 65 -  99 mg/dL    Discharge Medications:     Medication List    ASK your doctor about these medications        ALPRAZolam 0.5 MG tablet  Commonly known as:  XANAX  Take 0.5 mg by mouth 2 (two) times daily as needed.     atorvastatin 20 MG tablet  Commonly known as:  LIPITOR  Take 20 mg by mouth daily.     hydrOXYzine 10 MG tablet  Commonly known as:  ATARAX/VISTARIL  Take 10 mg by mouth 3 (three) times daily as needed.     ibuprofen 800 MG tablet  Commonly known as:  ADVIL,MOTRIN  Take 800 mg by mouth 3 (three) times daily as needed.     INVOKANA 300 MG Tabs tablet  Generic drug:  canagliflozin  Take 300 mg by mouth daily before breakfast.     lisinopril 20 MG tablet  Commonly known as:  PRINIVIL,ZESTRIL  Take 20 mg by mouth daily.     LYRICA 200 MG capsule  Generic drug:  pregabalin  Take 200 mg by mouth 3 (three) times daily.     omeprazole 40 MG capsule  Commonly known as:  PRILOSEC  Take 40 mg by mouth daily.     oxyCODONE 20 mg 12 hr tablet  Commonly known as:  OXYCONTIN  Take 20 mg by mouth 2 (two) times daily.      oxyCODONE 15 MG immediate release tablet  Commonly known as:  ROXICODONE  Take 15 mg by mouth every 4 (four) hours as needed.     tiZANidine 4 MG tablet  Commonly known as:  ZANAFLEX  Take 4 mg by mouth 2 (two) times daily as needed.     TOUJEO SOLOSTAR 300 UNIT/ML Sopn  Generic drug:  Insulin Glargine  Inject into the skin. Reported on 09/26/2015        Diagnostic Studies: Dg Ankle 2 Views Left  09/26/2015  CLINICAL DATA:  Patient for external fixation tibia and fibula. EXAM: DG C-ARM 61-120 MIN; LEFT ANKLE - 2 VIEW COMPARISON:  None. FINDINGS: AP and lateral fluoroscopic images demonstrate hardware overlying the left tibia and fibula. Comminuted distal tibia fracture visualized. Osseous structures poorly characterized given fluoroscopic technique. IMPRESSION: External hardware overlying the distal left tibia and fibula for external fixation. Electronically Signed   By: Annia Belt M.D.   On: 09/26/2015 15:20   Dg C-arm 1-60 Min  09/26/2015  CLINICAL DATA:  Patient for external fixation tibia and fibula. EXAM: DG C-ARM 61-120 MIN; LEFT ANKLE - 2 VIEW COMPARISON:  None. FINDINGS: AP and lateral fluoroscopic images demonstrate hardware overlying the left tibia and fibula. Comminuted distal tibia fracture visualized. Osseous structures poorly characterized given fluoroscopic technique. IMPRESSION: External hardware overlying the distal left tibia and fibula for external fixation. Electronically Signed   By: Annia Belt M.D.   On: 09/26/2015 15:20    Disposition: home        Follow-up Information    Follow up with Toni Arthurs, MD. Call in 2 days.   Specialty:  Orthopedic Surgery   Why:  [phone], Dr Victorino Dike requested to the see the patient in the clinic this week!   Contact information:   [address] [place] [address] [phone]        Signed: Verlee Rossetti 09/27/2015, 7:51 AM

## 2015-09-27 NOTE — Evaluation (Signed)
Occupational Therapy Evaluation Patient Details Name: Alisha Mendoza MRN: [id number] DOB: February 10, 1971 Today's Date: 09/27/2015    History of Present Illness Pt tripped and fell sustaining left distal tib/ fib comminuted fx. Transferred to [facility] from [place] for mgmt and underwent ex fix on 09/26/15. PMH: DM, multiple arthroscopic surgeries to right knee   Clinical Impression   OT consulted for fabrication of foot plate.  Foot plate fabricated with ankle at 90* dorsiflexion. Began education with pt, re: wear and care, but pt very lethargic.  Will return to provide instruction to caregiver.     Follow Up Recommendations  No OT follow up    Equipment Recommendations  None recommended by OT    Recommendations for Other Services       Precautions / Restrictions Precautions Precautions: Fall Restrictions Weight Bearing Restrictions: Yes LLE Weight Bearing: Non weight bearing      Mobility Bed Mobility                  Transfers                      Balance                                            ADL                                               Vision     Perception     Praxis      Pertinent Vitals/Pain Pain Assessment: 0-10 Pain Score: 6  Pain Location: Lt ankle  Pain Descriptors / Indicators: Aching Pain Intervention(s): Monitored during session     Hand Dominance Right   Extremity/Trunk Assessment             Communication Communication Communication: No difficulties   Cognition Arousal/Alertness: Lethargic Behavior During Therapy: WFL for tasks assessed/performed Overall Cognitive Status: Within Functional Limits for tasks assessed                     General Comments       Exercises       Shoulder Instructions      Home Living Family/patient expects to be discharged to:: Private residence Living Arrangements: Children Available Help at Discharge:  Family;Friend(s);Available PRN/intermittently Type of Home: House Home Access: Stairs to enter [employer] of Steps: 2 Entrance Stairs-Rails: None Home Layout: One level               Home Equipment: Walker - standard   Additional Comments: pt's boyfriend lives down the street and will be with her often, her children are 44 and 17 and can assist when not at school      Prior Functioning/Environment Level of Independence: Independent             OT Diagnosis: Acute pain   OT Problem List: Pain   OT Treatment/Interventions: Splinting;Patient/family education    OT Goals(Current goals can be found in the care plan section) Acute Rehab OT Goals Patient Stated Goal: to go home  OT Goal Formulation: With patient Time For Goal Achievement: 09/28/15 Potential to Achieve Goals: Good ADL Goals Additional ADL Goal #1: Pt/caregiver will be independent with foot plate wear  and care   OT Frequency: Min 2X/week   Barriers to D/C:            Co-evaluation              End of Session    Activity Tolerance: Patient tolerated treatment well Patient left: in chair;with call bell/phone within reach   Time: 1410-1505 OT Time Calculation (min): 55 min Charges:  OT General Charges $OT Visit: 1 Procedure OT Evaluation $OT Eval Moderate Complexity: 1 Procedure OT Treatments $Orthotics Fit/Training: 38-52 mins G-Codes:    Devere Brem M 09/27/2015, 3:52 PM

## 2015-09-27 NOTE — Progress Notes (Signed)
Occupational Therapy treatment note:  Instruction re: wear and care of foot plate completed with pt and caregiver.  Written info provided.  They were able to verbalize understanding of all. Goals met, pt ready for discharge.     09/27/15 1553  OT Visit Information  Last OT Received On 09/27/15  Assistance Needed +1  History of Present Illness Pt tripped and fell sustaining left distal tib/ fib comminuted fx. Transferred to [facility] from [facility] for mgmt and underwent ex fix on 09/26/15. PMH: DM, multiple arthroscopic surgeries to right knee  Precautions  Precautions Fall  Pain Assessment  Pain Assessment 0-10  Pain Score 6  Pain Location Lt ankle   Pain Descriptors / Indicators Aching  Pain Intervention(s) Monitored during session  Cognition  Arousal/Alertness Lethargic  Behavior During Therapy WFL for tasks assessed/performed  Overall Cognitive Status Within Functional Limits for tasks assessed  Restrictions  Weight Bearing Restrictions Yes  LLE Weight Bearing NWB  Exercises  Exercises Other exercises  Other Exercises  Other Exercises Pt and significant other instructed in how to don/doff foot plate, how to monitor skin, signs of increased pressure, how to care for foot plate.  Written info also provided.  Pt continues to be lethargic, but caregiver verbalizes understanding of all. All questions answered.   OT - End of Session  Activity Tolerance Patient tolerated treatment well  Patient left in chair;with call bell/phone within reach;with family/visitor present  OT Assessment/Plan  OT Plan All goals met and education completed, patient discharged from OT services  Follow Up Recommendations No OT follow up  OT Equipment None recommended by OT  OT Goal Progression  Progress towards OT goals Goals met/education completed, patient discharged from OT  OT Time Calculation  OT Start Time (ACUTE ONLY) 1525  OT Stop Time (ACUTE ONLY) 1534  OT Time Calculation (min) 9 min  OT General  Charges  $OT Visit 1 Procedure  OT Treatments  $Orthotics Fit/Training 8-22 mins  [employer], OTR/L [phone]

## 2015-09-27 NOTE — Progress Notes (Signed)
CM received call for DME; CM called DME rep, James to please deliver the rolling walker, 3n1, and wheelchair so pt can discharge. No other CM needs were communicated.

## 2015-09-29 ENCOUNTER — Encounter (HOSPITAL_COMMUNITY): Payer: Self-pay | Admitting: Orthopedic Surgery

## 2018-01-07 IMAGING — CT CT ANKLE*L* W/O CM
2 of 5 series · 6 of 33 positions shown, 8 images · non-contrast
Comparison: Radiographs 09/26/2015

CLINICAL DATA: Evaluate complex left ankle fracture.

Nonspecific (abnormal) findings on radiological and other
examination of musculoskeletal sysem.
EXAM:
CT OF THE LEFT ANKLE WITHOUT CONTRAST. 3D reconstructed images were
performed.
TECHNIQUE: Multidetector CT imaging of the left ankle was performed according
to the standard protocol. Multiplanar CT image reconstructions were
also generated.

[Series 205: o-mar, soft thins · axial · 0.24mm/px · z∈[-443,-242]mm · 5 of 505 slices shown, 7 images]
[im 51/505  soft-tissue]
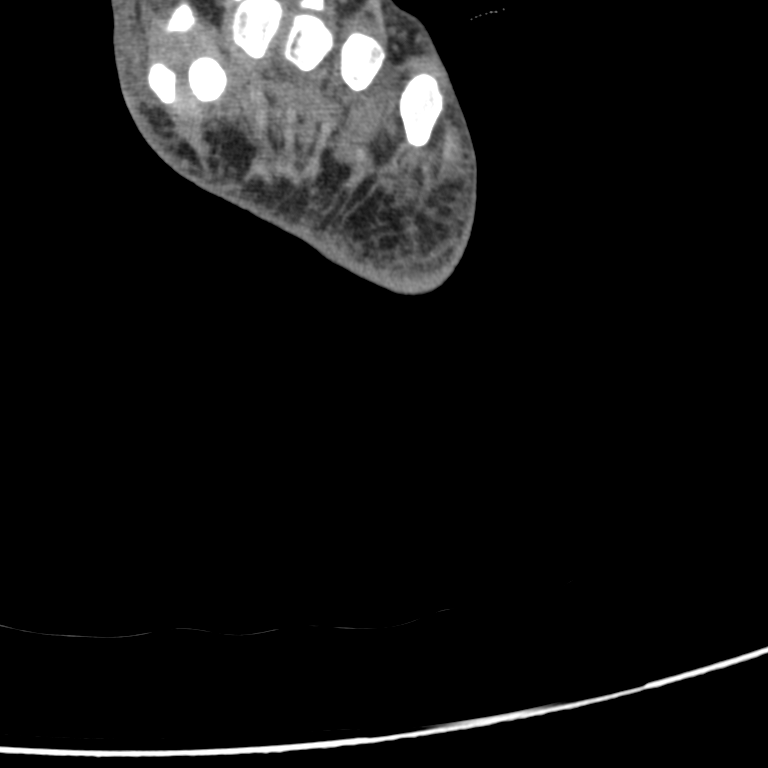
[im 51/505  bone]
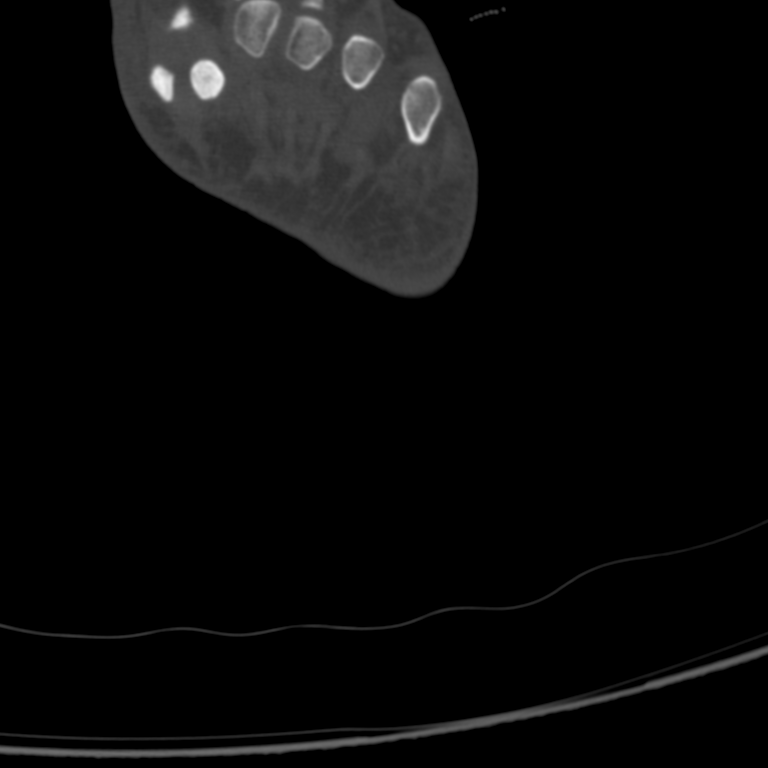
[im 152/505  bone]
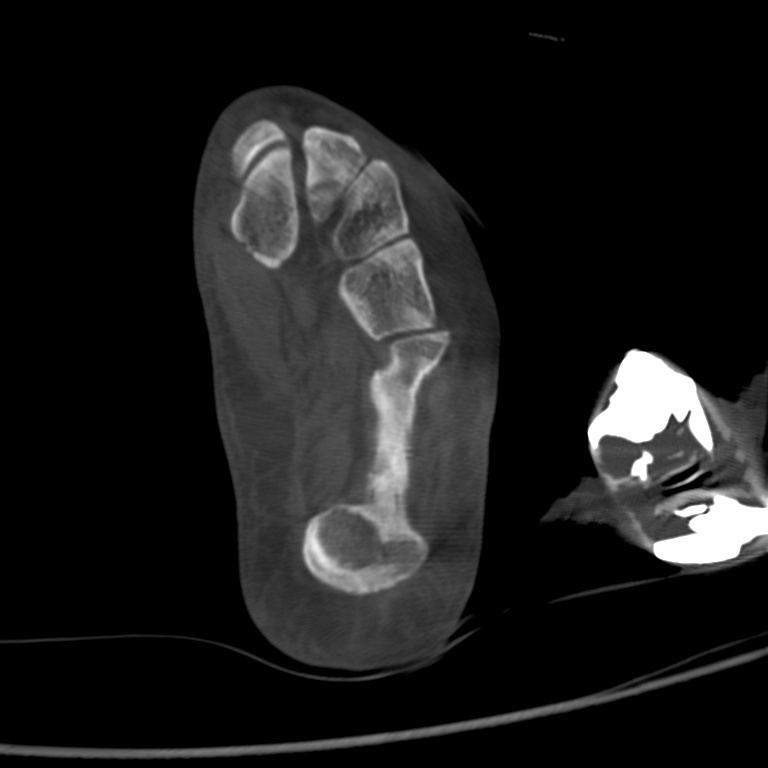
[im 253/505  bone]
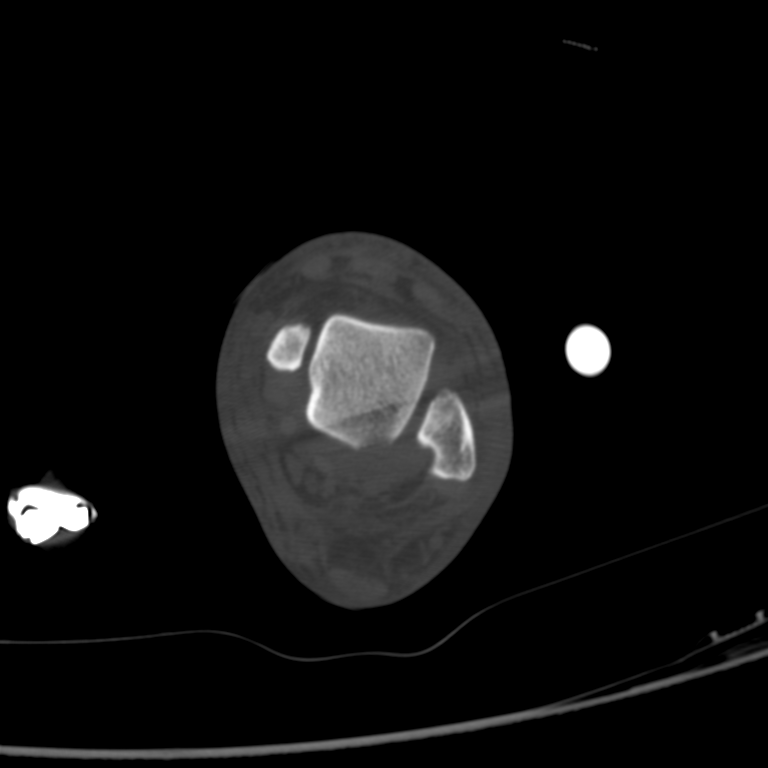
[im 353/505  bone]
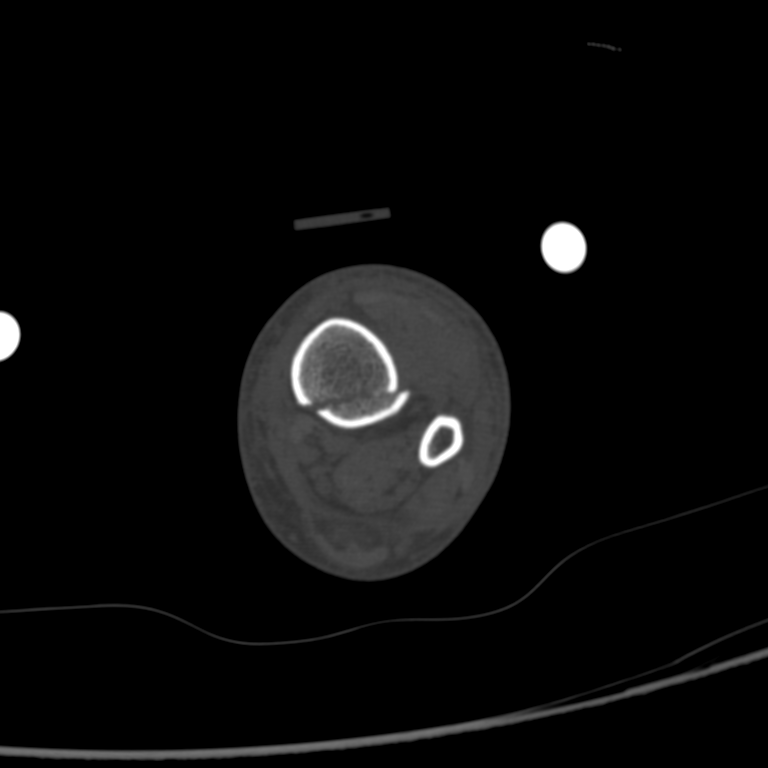
[im 454/505  soft-tissue]
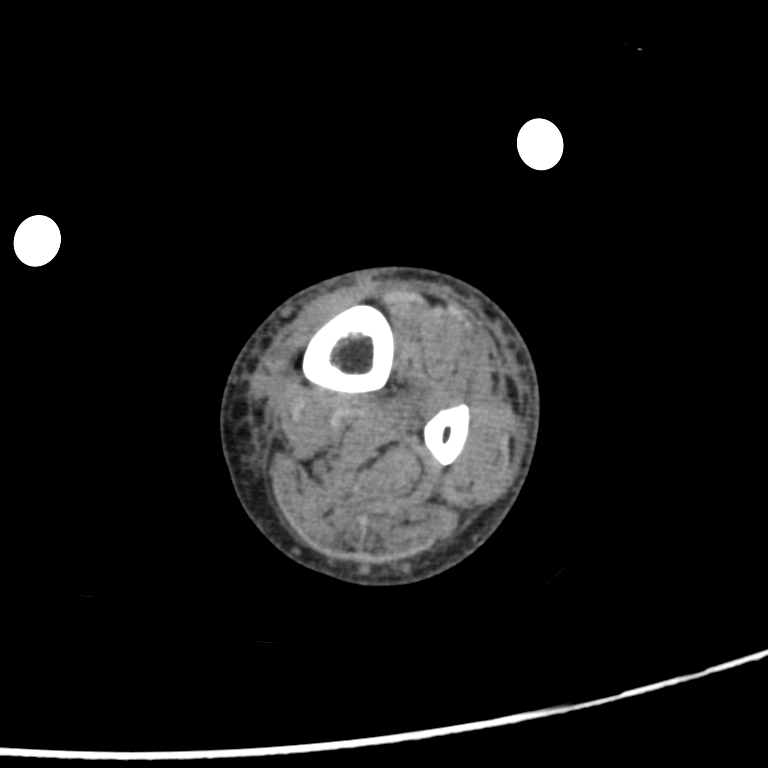
[im 454/505  bone]
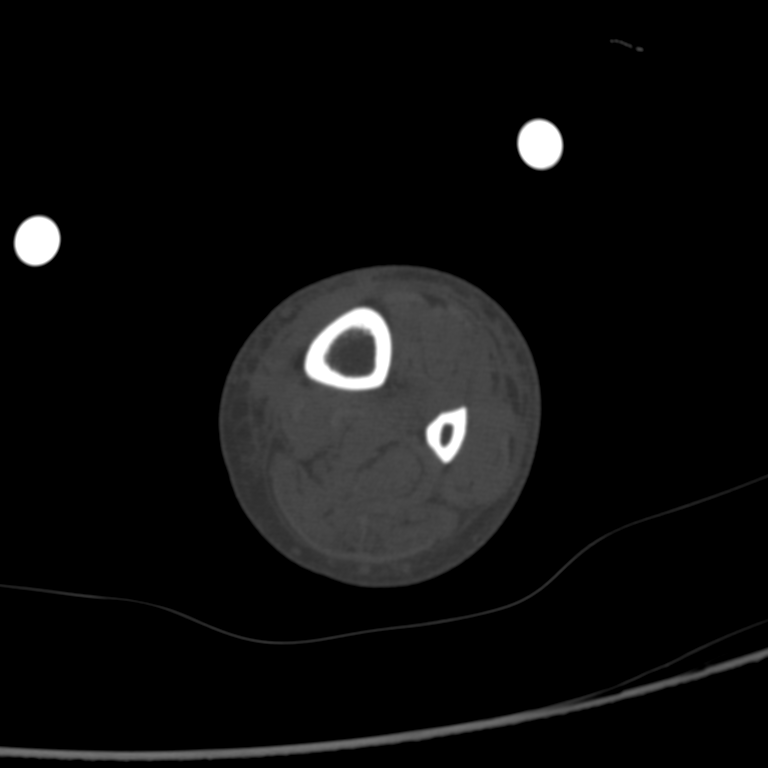

[Series 2012: coronal bone · coronal · 0.24mm/px · 1 of 60 slices shown]
[im 30/60  bone]
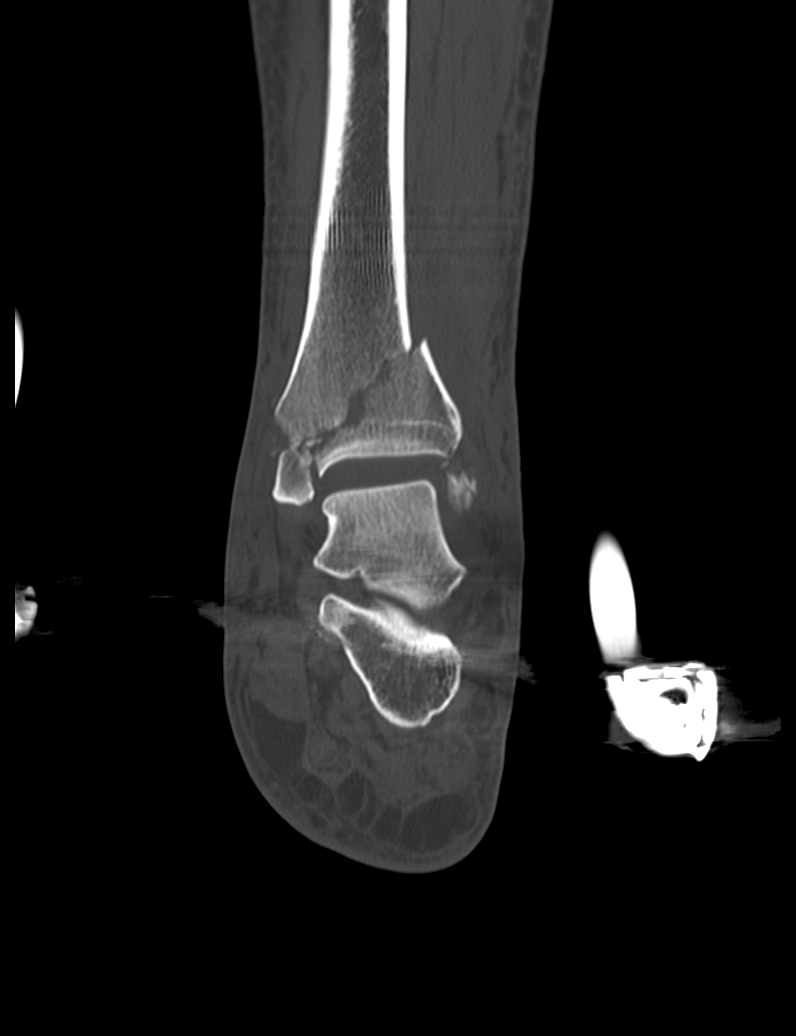

[6 of 33 positions shown; findings below may reference images not displayed]

FINDINGS: Complex spiral type comminuted intra-articular fracture of the
tibia. The main fracture line is an oblique coursing intra-articular
fractured exiting through the anterior tibial epiphysis. This also
isolated posterior malleolus fracture. Oblique coursing fracture
through the medial malleolus. No associated fibular fracture. Tiny
fracture fragment noted in the lateral joint space. The ankle joint
is distracted by the external fixator.

The talus is intact. The subtalar joints are normal. External
fixator noted in the calcaneus.
IMPRESSION: Complex comminuted intra-articular distal tibial fracture with
maximum depression of 3.8 mm.

No fibula or talar fracture.

The joint is distracted by the external fixator.
